# Patient Record
Sex: Female | Born: 1960 | Race: Black or African American | Hispanic: No | Marital: Married | Smoking: Never smoker
Health system: Southern US, Community
[De-identification: ages and names within clinical notes are randomized; demographics above are authoritative.]

---

## 2011-03-02 DIAGNOSIS — K219 Gastro-esophageal reflux disease without esophagitis: Secondary | ICD-10-CM | POA: Insufficient documentation

## 2011-03-02 DIAGNOSIS — I10 Essential (primary) hypertension: Secondary | ICD-10-CM | POA: Insufficient documentation

## 2011-11-02 DIAGNOSIS — C50411 Malignant neoplasm of upper-outer quadrant of right female breast: Secondary | ICD-10-CM | POA: Insufficient documentation

## 2013-03-23 ENCOUNTER — Emergency Department: Payer: Self-pay | Admitting: Emergency Medicine

## 2015-06-26 DIAGNOSIS — G43019 Migraine without aura, intractable, without status migrainosus: Secondary | ICD-10-CM | POA: Insufficient documentation

## 2015-06-26 DIAGNOSIS — G2581 Restless legs syndrome: Secondary | ICD-10-CM | POA: Insufficient documentation

## 2015-07-07 IMAGING — CT CT HEAD WITHOUT CONTRAST
1 series · 16 of 28 positions shown, 20 images · non-contrast
Comparison: None.

CLINICAL DATA: Frontal headache with nausea and vomiting.

EXAM:
CT HEAD WITHOUT CONTRAST
TECHNIQUE: Contiguous axial images were obtained from the base of the skull
through the vertex without intravenous contrast.

[Series 2: soft tissue · axial · 0.42mm/px · z∈[+296,+421]mm · 16 of 28 slices shown, 20 images]
[im 2/28  brain]
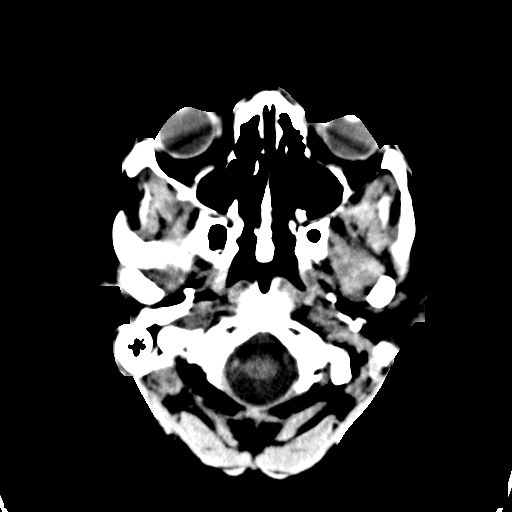
[im 2/28  bone]
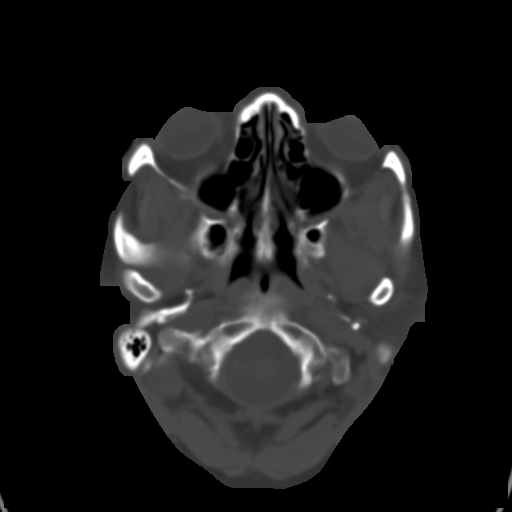
[im 4/28  brain]
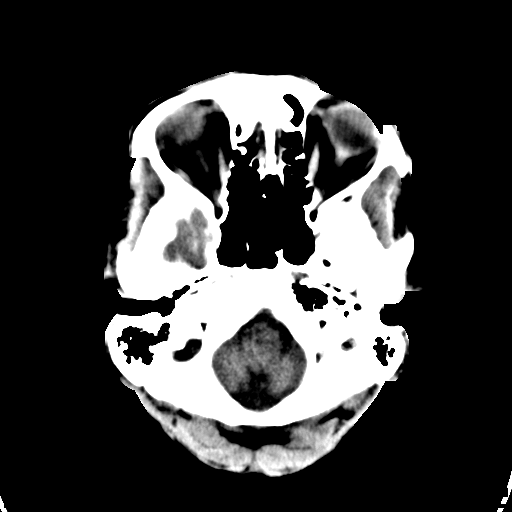
[im 6/28  brain]
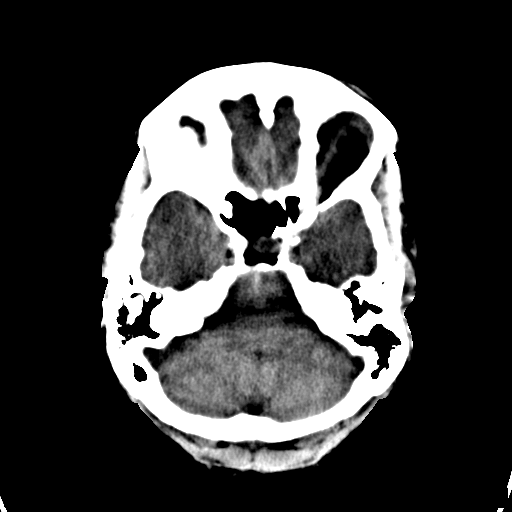
[im 7/28  brain]
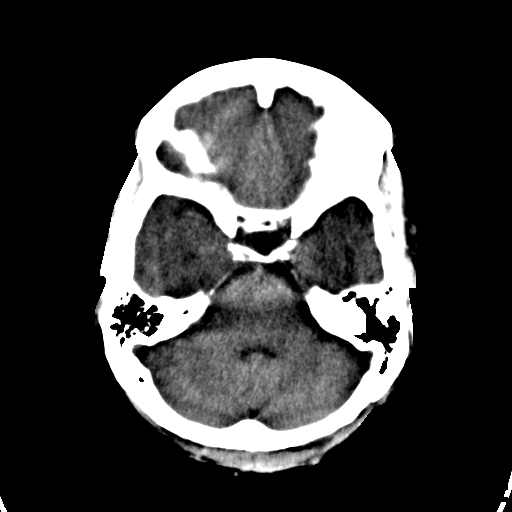
[im 9/28  brain]
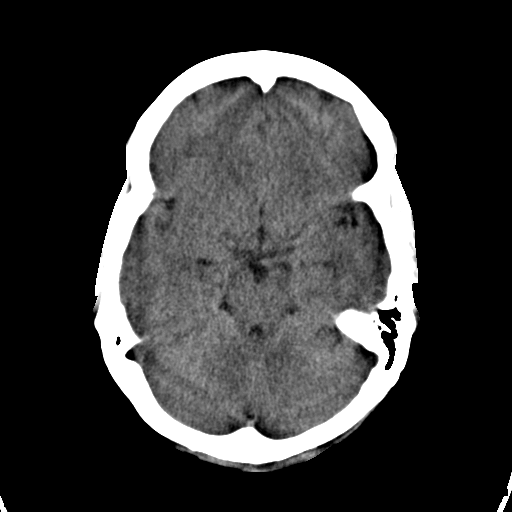
[im 9/28  bone]
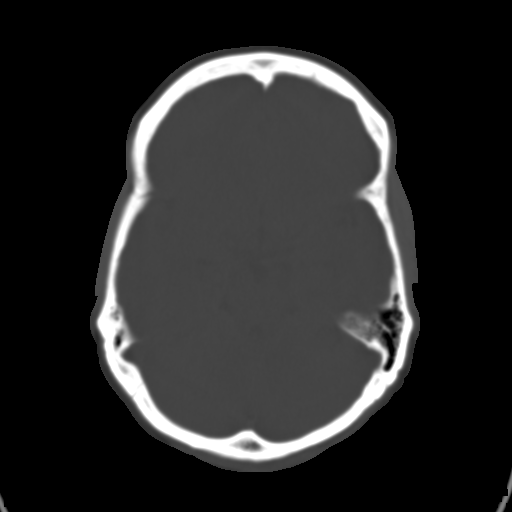
[im 10/28  brain]
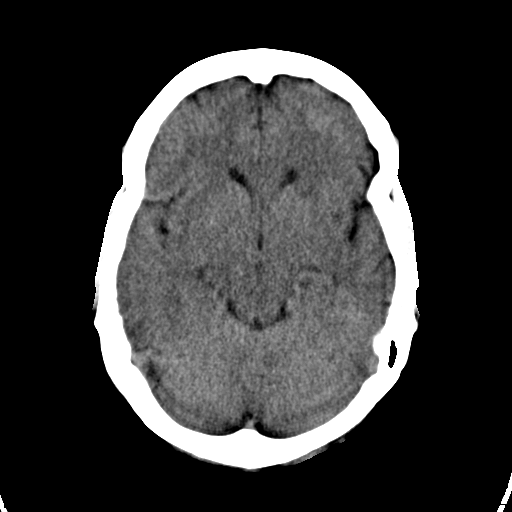
[im 12/28  brain]
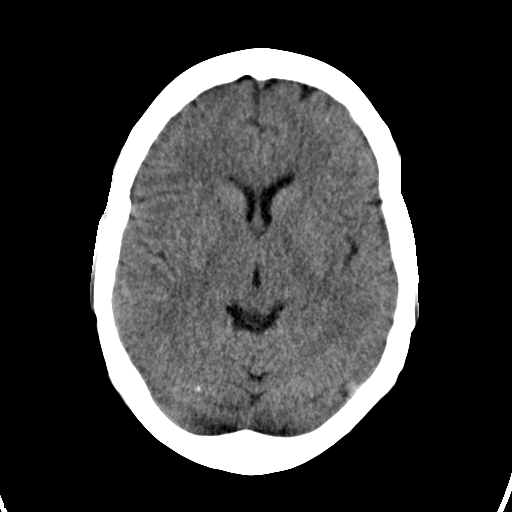
[im 14/28  brain]
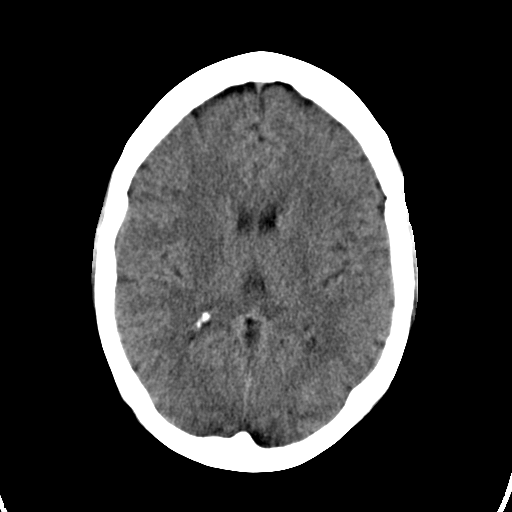
[im 15/28  brain]
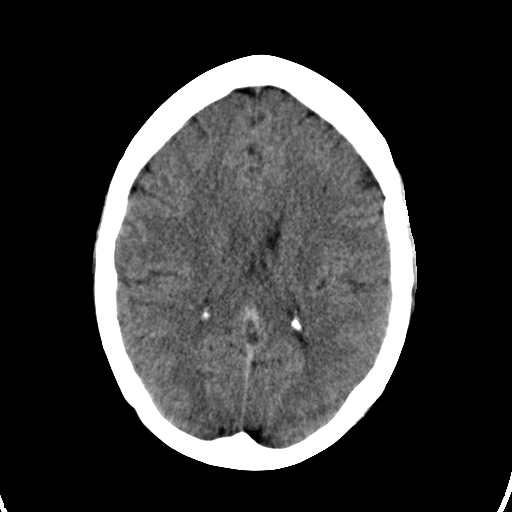
[im 15/28  bone]
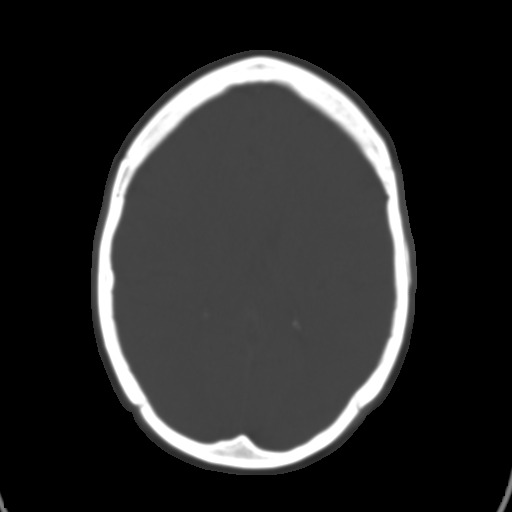
[im 17/28  brain]
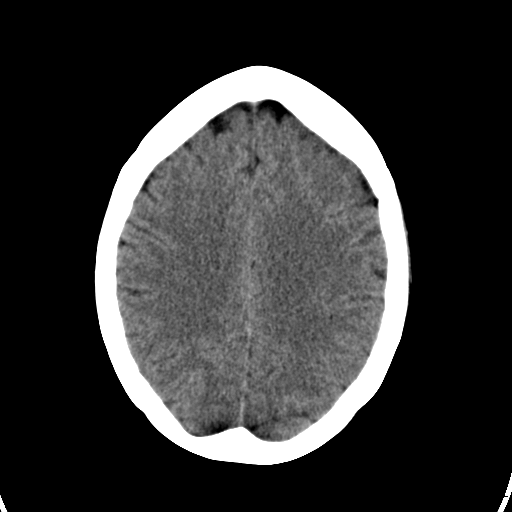
[im 19/28  brain]
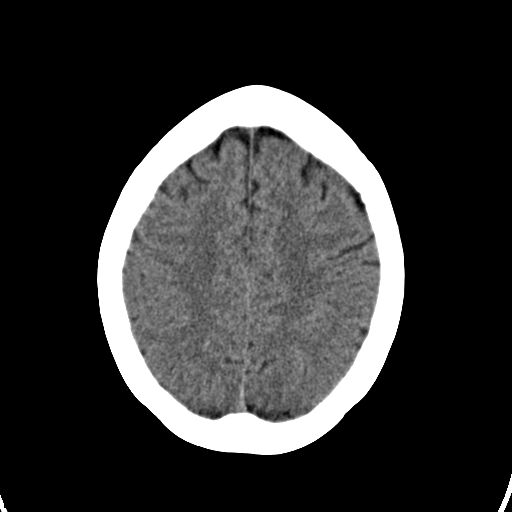
[im 20/28  brain]
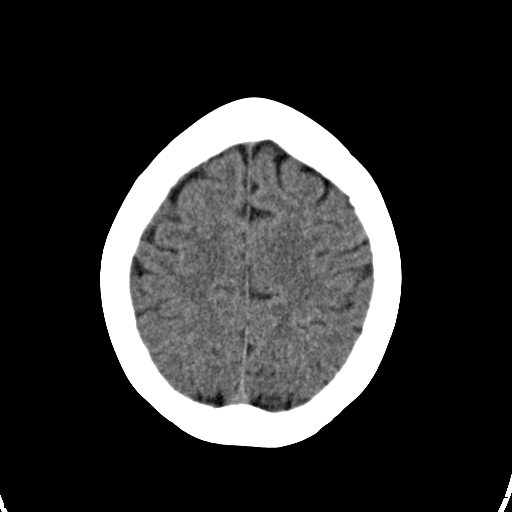
[im 22/28  brain]
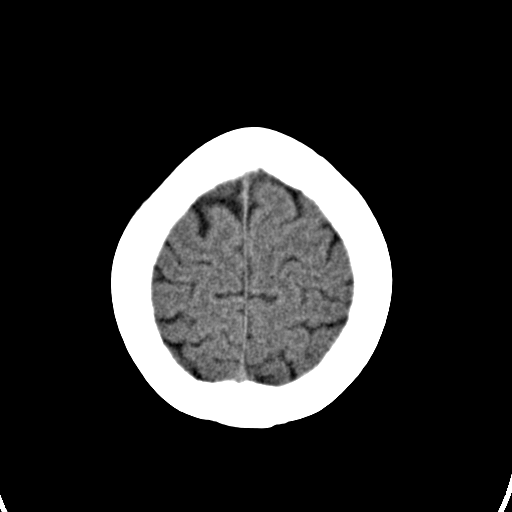
[im 22/28  bone]
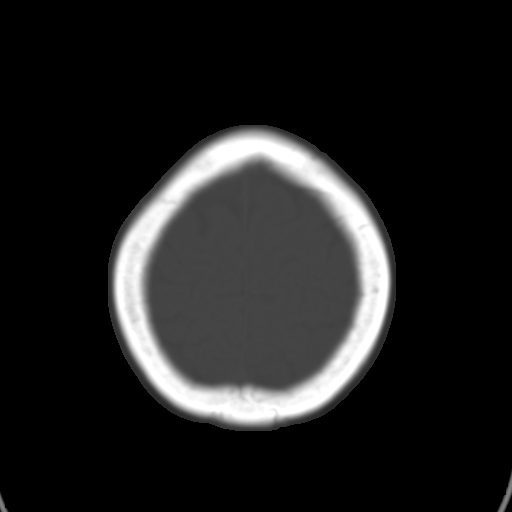
[im 23/28  brain]
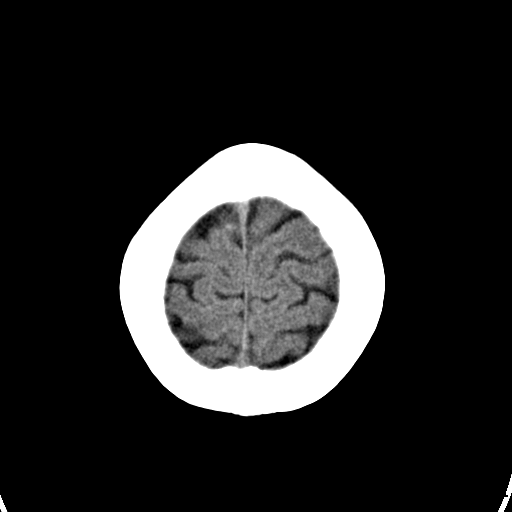
[im 25/28  brain]
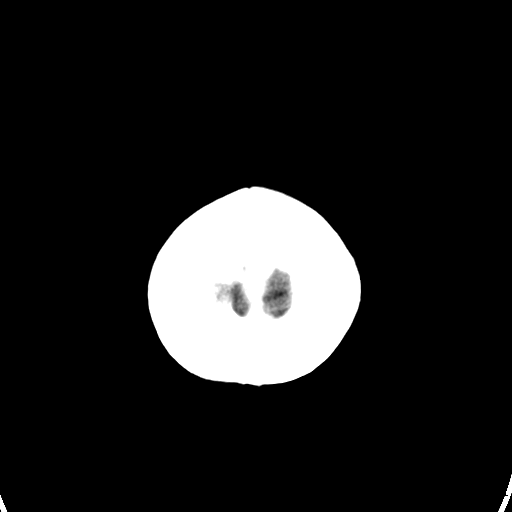
[im 27/28  brain]
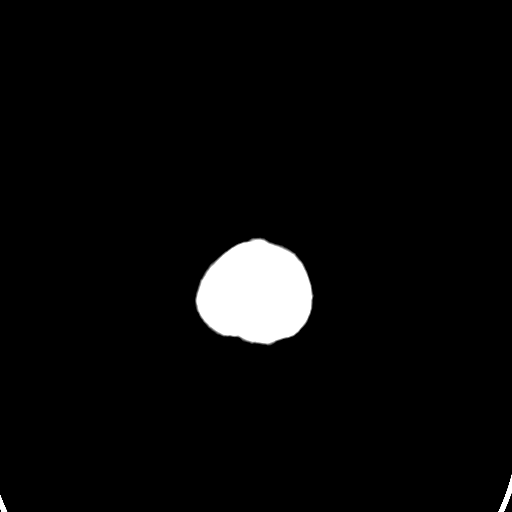

[16 of 28 positions shown; findings below may reference images not displayed]

FINDINGS: The ventricles are normal in size and position. There is no
intracranial hemorrhage. There is no intracranial mass effect. There
is no finding to suggest an evolving ischemic infarction. The
cerebellum and brainstem exhibit no acute abnormalities.

At bone window settings the observed portions of the paranasal
sinuses and mastoid air cells are clear. There is no evidence of an
acute skull fracture.
IMPRESSION: There is no acute intracranial abnormality.

## 2020-06-04 DIAGNOSIS — E669 Obesity, unspecified: Secondary | ICD-10-CM | POA: Insufficient documentation

## 2020-06-04 DIAGNOSIS — G4733 Obstructive sleep apnea (adult) (pediatric): Secondary | ICD-10-CM | POA: Insufficient documentation

## 2021-09-16 NOTE — Progress Notes (Signed)
[facility] [place], [place] [address]  Pulmonary Sleep Medicine   Office Visit Note  Patient Name: Tamara Marks DOB: [birth year] MRN [id number]    Chief Complaint: Obstructive Sleep Apnea visit  Brief History:  Tamara Marks is seen today for initial sleep consult to establish care on APAP 7-11cmh20.  The patient has a long history of sleep apnea. Patient is using PAP nightly.  The patient feels rested after sleeping with PAP.  The patient reports benefit from PAP use. Reported sleepiness is  improved and the Epworth Sleepiness Score is 3 out of 24. The patient does not take naps. The patient complains of the following: dry mouth, and mask discomfort. Tamara Marks has tried several masks and the one Tamara Marks has is the best. Tamara Marks has used lozenges and adjusted humidity and can't seem to get relief. Suggested adding humidity to the room to help this. The compliance download shows 100% compliance (for period since replacement recall machine set) with an average use time of 9 hours. The AHI is 1.7  The patient does not complain of limb movements disrupting sleep. Before CPAP patient reports that family members reported Tamara Marks loud snoring and witnessed apneas.   ROS  General: (-) fever, (-) chills, (-) night sweat Nose and Sinuses: (-) nasal stuffiness or itchiness, (-) postnasal drip, (-) nosebleeds, (-) sinus trouble. Mouth and Throat: (-) sore throat, (-) hoarseness. Neck: (-) swollen glands, (-) enlarged thyroid, (-) neck pain. Respiratory: - cough, - shortness of breath, - wheezing. Neurologic: - numbness, - tingling. Psychiatric: - anxiety, - depression   Current Medication: Outpatient Encounter Medications as of 09/17/2021  Medication Sig   amLODipine (NORVASC) 10 MG tablet amlodipine 10 mg tablet   fluticasone (FLONASE) 50 MCG/ACT nasal spray fluticasone propionate 50 mcg/actuation nasal spray,suspension   lisinopril (ZESTRIL) 40 MG tablet lisinopril 40 mg tablet    metFORMIN (GLUCOPHAGE-XR) 500 MG 24 hr tablet metformin ER 500 mg tablet,extended release 24 hr   metoprolol succinate (TOPROL-XL) 100 MG 24 hr tablet metoprolol succinate ER 100 mg tablet,extended release 24 hr   famotidine (PEPCID) 20 MG tablet Take 20 mg by mouth at bedtime.   FERATE 240 (27 Fe) MG tablet Take 1 tablet by mouth daily.   loratadine (CLARITIN) 10 MG tablet Take by mouth.   meclizine (ANTIVERT) 25 MG tablet Take 25 mg by mouth daily.   pantoprazole (PROTONIX) 40 MG tablet Take 40 mg by mouth 2 (two) times daily.   rosuvastatin (CRESTOR) 10 MG tablet Take 10 mg by mouth daily.   SUMAtriptan (IMITREX) 100 MG tablet sumatriptan 196 mg tablet   TRULICITY 1.5 QI/2.9NL SOPN SMARTSIG:0.5 Milliliter(s) SUB-Q Once a Week   No facility-administered encounter medications on file as of 09/17/2021.    Surgical History: History reviewed. No pertinent surgical history.  Medical History: History reviewed. No pertinent past medical history.  Family History: Non contributory to the present illness  Social History: Social History   Socioeconomic History   Marital status: Married    Spouse name: Not on file   Number of children: Not on file   Years of education: Not on file   Highest education level: Not on file  Occupational History   Not on file  Tobacco Use   Smoking status: Never   Smokeless tobacco: Never  Substance and Sexual Activity   Alcohol use: Not Currently   Drug use: Not on file   Sexual activity: Not on file  Other Topics Concern   Not on file  Social History Narrative   Not on file   Social Determinants of Health   Financial Resource Strain: Not on file  Food Insecurity: Not on file  Transportation Needs: Not on file  Physical Activity: Not on file  Stress: Not on file  Social Connections: Not on file  Intimate Partner Violence: Not on file    Vital Signs: Blood pressure 130/88, pulse 89, resp. rate 12, height '5\' 6"'$  (1.676 m), weight 190 lb (86.2  kg), SpO2 97 %. Body mass index is 30.67 kg/m.    Examination: General Appearance: The patient is well-developed, well-nourished, and in no distress. Neck Circumference: 37.5cm Skin: Gross inspection of skin unremarkable. Head: normocephalic, no gross deformities. Eyes: no gross deformities noted. ENT: ears appear grossly normal Neurologic: Alert and oriented. No involuntary movements.    EPWORTH SLEEPINESS SCALE:  Scale:  (0)= no chance of dozing; (1)= slight chance of dozing; (2)= moderate chance of dozing; (3)= high chance of dozing  Chance  Situtation    Sitting and reading: 1    Watching TV: 1    Sitting Inactive in public: 0    As a passenger in car: 0      Lying down to rest: 1    Sitting and talking: 0    Sitting quielty after lunch: 0    In a car, stopped in traffic: 0   TOTAL SCORE:   3 out of 24    SLEEP STUDIES:  PSG (04/14/2011)-AHI 5.9, (REM 14.9), min SP02 77% CPAP(04/16/2011)  CPAP'@5cmh20'$  PSG (09/04/2013) AHI 22.5, min SP02 66% CPAP (09/25/2013)APAP 7-11cmh20 PSG (01/10/2020)AHI 7.3, min SP02- 69%   CPAP COMPLIANCE DATA:  Date Range: 09/03/21-09/16/21  Average Daily Use: 9 hours  Median Use: 9 hours  Compliance for > 4 Hours: 100% days  AHI: 1.7 respiratory events per hour  Days Used: 14/14  Mask Leak: 2 minute  95th Percentile Pressure: 10.4         LABS: No results found for this or any previous visit (from the past 2160 hour(s)).  Radiology: CT HEAD LIMITED W/O CM  Result Date: 03/23/2013 * PRIOR REPORT IMPORTED FROM AN EXTERNAL SYSTEM **CLINICAL DATA:  Frontal headache with nausea and vomiting. EXAM: CT HEAD WITHOUT CONTRAST TECHNIQUE: Contiguous axial images were obtained from the base of the skull through the vertex without intravenous contrast. COMPARISON:  None. FINDINGS: The ventricles are normal in size and position. There is no intracranial hemorrhage. There is no intracranial mass effect. There is no finding to  suggest an evolving ischemic infarction. The cerebellum and brainstem exhibit no acute abnormalities. At bone window settings the observed portions of the paranasal sinuses and mastoid air cells are clear. There is no evidence of an acute skull fracture. IMPRESSION: There is no acute intracranial abnormality. Electronically Signed   By: David  [place]   On: 03/23/2013 16:36     No results found.  No results found.    Assessment and Plan: Patient Active Problem List   Diagnosis Date Noted   Class 1 obesity with serious comorbidity and body mass index (BMI) of 32.0 to 32.9 in adult 06/04/2020   OSA (obstructive sleep apnea) 06/04/2020   Intractable migraine without aura and without status migrainosus 06/26/2015   Restless leg syndrome, uncontrolled 06/26/2015   Malignant neoplasm of upper-outer quadrant of right breast in female, estrogen receptor negative ([place]) 11/02/2011   GERD (gastroesophageal reflux disease) 03/02/2011   Hypertension 03/02/2011      The patient does tolerate PAP and reports  benefit from PAP use. The patient was reminded how to adjust mask fit and advised to change supplies regularly. The patient was also counselled on nightly use. The compliance is excellent. The AHI is 1.7.   1. OSA (obstructive sleep apnea) Continue excellent compliance  2. CPAP use counseling CPAP couseling-Discussed importance of adequate CPAP use as well as proper care and cleaning techniques of machine and all supplies.  3. Primary hypertension Continue current medication and f/u with PCP.  4. Intractable migraine without aura and without status migrainosus Continue current medication and f/u with PCP.  5. Gastroesophageal reflux disease without esophagitis Continue PPI  6. Obesity (BMI 30.0-34.9) Obesity Counseling: Had a lengthy discussion regarding patients BMI and weight issues. Patient was instructed on portion control as well as increased activity. Also discussed caloric  restrictions with trying to maintain intake less than 2000 Kcal. Discussions were made in accordance with the 5As of weight management. Simple actions such as not eating late and if able to, taking a walk is suggested.    General Counseling: I have discussed the findings of the evaluation and examination with Tamara Marks.  I have also discussed any further diagnostic evaluation thatmay be needed or ordered today. Tamara Marks verbalizes understanding of the findings of todays visit. We also reviewed Tamara Marks medications today and discussed drug interactions and side effects including but not limited excessive drowsiness and altered mental states. We also discussed that there is always a risk not just to Tamara Marks but also people around Tamara Marks. Tamara Marks has been encouraged to call the office with any questions or concerns that should arise related to todays visit.  No orders of the defined types were placed in this encounter.       I have personally obtained a history, examined the patient, evaluated laboratory and imaging results, formulated the assessment and plan and placed orders.  This patient was seen by Drema Dallas, PA-C in collaboration with Dr. Devona Konig as a part of collaborative care agreement.  Allyne Gee, MD [facility] Diplomate ABMS Pulmonary Critical Care Medicine and Sleep Medicine

## 2021-09-17 ENCOUNTER — Ambulatory Visit (INDEPENDENT_AMBULATORY_CARE_PROVIDER_SITE_OTHER): Payer: BC Managed Care – PPO | Admitting: Internal Medicine

## 2021-09-17 VITALS — BP 130/88 | HR 89 | Resp 12 | Ht 66.0 in | Wt 190.0 lb

## 2021-09-17 DIAGNOSIS — Z6834 Body mass index (BMI) 34.0-34.9, adult: Secondary | ICD-10-CM

## 2021-09-17 DIAGNOSIS — I10 Essential (primary) hypertension: Secondary | ICD-10-CM | POA: Diagnosis not present

## 2021-09-17 DIAGNOSIS — E669 Obesity, unspecified: Secondary | ICD-10-CM

## 2021-09-17 DIAGNOSIS — Z7189 Other specified counseling: Secondary | ICD-10-CM

## 2021-09-17 DIAGNOSIS — K219 Gastro-esophageal reflux disease without esophagitis: Secondary | ICD-10-CM

## 2021-09-17 DIAGNOSIS — G43019 Migraine without aura, intractable, without status migrainosus: Secondary | ICD-10-CM

## 2021-09-17 DIAGNOSIS — G4733 Obstructive sleep apnea (adult) (pediatric): Secondary | ICD-10-CM

## 2021-09-17 NOTE — Patient Instructions (Signed)

## 2022-09-14 NOTE — Progress Notes (Unsigned)
[facility] [address] [place], [place] [address]  Pulmonary Sleep Medicine   Office Visit Note  Patient Name: Tamara Marks DOB: October 19, 1960 MRN [id number]    Chief Complaint: Obstructive Sleep Apnea visit  Brief History:  Tamara Marks is seen today for an annual follow up visit fo APAP@7 -11 cmH2O. The patient has a long history of sleep apnea. Patient is using PAP nightly.  The patient feels rested after sleeping with PAP.  The patient reports benefiting from PAP use. Reported sleepiness is  improved and the Epworth Sleepiness Score is 3 out of 24. The patient will sometimes take naps. The patient complains of the following: none.  The compliance download shows 99.5% compliance with an average use time of 9 hours 37 minutes. The AHI is 2.4.  The patient does not complain of limb movements disrupting sleep. The patient continues to require PAP therapy in order to eliminate sleep apnea. Her machine is past end of life and needs to be replaced.  ROS  General: (-) fever, (-) chills, (-) night sweat Nose and Sinuses: (-) nasal stuffiness or itchiness, (-) postnasal drip, (-) nosebleeds, (-) sinus trouble. Mouth and Throat: (-) sore throat, (-) hoarseness. Neck: (-) swollen glands, (-) enlarged thyroid, (-) neck pain. Respiratory: - cough, - shortness of breath, - wheezing. Neurologic: - numbness, - tingling. Psychiatric: - anxiety, - depression   Current Medication: Outpatient Encounter Medications as of 09/15/2022  Medication Sig   amLODipine (NORVASC) 10 MG tablet amlodipine 10 mg tablet   famotidine (PEPCID) 20 MG tablet Take 20 mg by mouth at bedtime.   FERATE 240 (27 Fe) MG tablet Take 1 tablet by mouth daily.   fluticasone (FLONASE) 50 MCG/ACT nasal spray fluticasone propionate 50 mcg/actuation nasal spray,suspension   lisinopril (ZESTRIL) 40 MG tablet lisinopril 40 mg tablet   loratadine (CLARITIN) 10 MG tablet Take by mouth.   meclizine (ANTIVERT) 25 MG  tablet Take 25 mg by mouth daily.   metFORMIN (GLUCOPHAGE-XR) 500 MG 24 hr tablet metformin ER 500 mg tablet,extended release 24 hr   metoprolol succinate (TOPROL-XL) 100 MG 24 hr tablet metoprolol succinate ER 100 mg tablet,extended release 24 hr   pantoprazole (PROTONIX) 40 MG tablet Take 40 mg by mouth 2 (two) times daily.   rosuvastatin (CRESTOR) 10 MG tablet Take 10 mg by mouth daily.   SUMAtriptan (IMITREX) 100 MG tablet sumatriptan 100 mg tablet   TRULICITY 1.5 MG/0.5ML SOPN SMARTSIG:0.5 Milliliter(s) SUB-Q Once a Week   No facility-administered encounter medications on file as of 09/15/2022.    Surgical History: History reviewed. No pertinent surgical history.  Medical History: History reviewed. No pertinent past medical history.  Family History: Non contributory to the present illness  Social History: Social History   Socioeconomic History   Marital status: Married    Spouse name: Not on file   Number of children: Not on file   Years of education: Not on file   Highest education level: Not on file  Occupational History   Not on file  Tobacco Use   Smoking status: Never   Smokeless tobacco: Never  Substance and Sexual Activity   Alcohol use: Not Currently   Drug use: Not on file   Sexual activity: Not on file  Other Topics Concern   Not on file  Social History Narrative   Not on file   Social Determinants of Health   Financial Resource Strain: Not on file  Food Insecurity: Not on file  Transportation Needs: Not on file  Physical Activity: Not on file  Stress: Not on file  Social Connections: Not on file  Intimate Partner Violence: Not on file    Vital Signs: Blood pressure 130/86, pulse 83, resp. rate 18, height 5\' 6"  (1.676 m), weight 176 lb (79.8 kg), SpO2 95 %. Body mass index is 28.41 kg/m.    Examination: General Appearance: The patient is well-developed, well-nourished, and in no distress. Neck Circumference: 37.5 cm Skin: Gross inspection  of skin unremarkable. Head: normocephalic, no gross deformities. Eyes: no gross deformities noted. ENT: ears appear grossly normal Neurologic: Alert and oriented. No involuntary movements.  STOP BANG RISK ASSESSMENT S (snore) Have you been told that you snore?     NO   T (tired) Are you often tired, fatigued, or sleepy during the day?   NO  O (obstruction) Do you stop breathing, choke, or gasp during sleep? NO   P (pressure) Do you have or are you being treated for high blood pressure? YES   B (BMI) Is your body index greater than 35 kg/m? NO   A (age) Are you 43 years old or older? YES   N (neck) Do you have a neck circumference greater than 16 inches?   NO   G (gender) Are you a female? NO   TOTAL STOP/BANG "YES" ANSWERS 2       A STOP-Bang score of 2 or less is considered low risk, and a score of 5 or more is high risk for having either moderate or severe OSA. For people who score 3 or 4, doctors may need to perform further assessment to determine how likely they are to have OSA.         EPWORTH SLEEPINESS SCALE:  Scale:  (0)= no chance of dozing; (1)= slight chance of dozing; (2)= moderate chance of dozing; (3)= high chance of dozing  Chance  Situtation    Sitting and reading: 1    Watching TV: 1    Sitting Inactive in public: 0    As a passenger in car: 0      Lying down to rest: 1    Sitting and talking: 0    Sitting quielty after lunch: 0    In a car, stopped in traffic: 0   TOTAL SCORE:   3 out of 24    SLEEP STUDIES:  PSG (03/2011) AHI 6/hr, REM AHI 15/hr, min SpO2 77% Titration (03/2011) CPAP@ 5 cmH2O PSG (08/2013) AHI 23/hr, min SpO2 66% Titration (09/2013) APAP@ 7-11 cmH2O PSG (12/2019) AHI 7.3/hr, min SpO2 69%   CPAP COMPLIANCE DATA:  Date Range: 09/15/2021-09/14/2022  Average Daily Use: 9 hours 37 minutes  Median Use: 9 hours 35 minutes  Compliance for > 4 Hours: 99.5%  AHI: 2.4 respiratory events per hour  Days Used: 364/365  days  Mask Leak: 32   95th Percentile Pressure: 10.8         LABS: No results found for this or any previous visit (from the past 2160 hour(s)).  Radiology: CT HEAD LIMITED W/O CM  Result Date: 03/23/2013 **** PRIOR REPORT IMPORTED FROM AN EXTERNAL SYSTEM **** CLINICAL DATA:  Frontal headache with nausea and vomiting. EXAM: CT HEAD WITHOUT CONTRAST TECHNIQUE: Contiguous axial images were obtained from the base of the skull through the vertex without intravenous contrast. COMPARISON:  None. FINDINGS: The ventricles are normal in size and position. There is no intracranial hemorrhage. There is no intracranial mass effect. There is no finding to suggest an evolving ischemic infarction. The  cerebellum and brainstem exhibit no acute abnormalities. At bone window settings the observed portions of the paranasal sinuses and mastoid air cells are clear. There is no evidence of an acute skull fracture. IMPRESSION: There is no acute intracranial abnormality. Electronically Signed   By: David  [place]   On: 03/23/2013 16:36     No results found.  No results found.    Assessment and Plan: Patient Active Problem List   Diagnosis Date Noted   Class 1 obesity with serious comorbidity and body mass index (BMI) of 32.0 to 32.9 in adult 06/04/2020   OSA (obstructive sleep apnea) 06/04/2020   Intractable migraine without aura and without status migrainosus 06/26/2015   Restless leg syndrome, uncontrolled 06/26/2015   Malignant neoplasm of upper-outer quadrant of right breast in female, estrogen receptor negative ([facility]) 11/02/2011   GERD (gastroesophageal reflux disease) 03/02/2011   Hypertension 03/02/2011      The patient does tolerate PAP and reports benefit from PAP use. The patient was reminded how to adjust mask fit and advised to change supplies regularly. The patient was also counselled on nightly use. The compliance is excellent. The AHI is 2.4.The patient's machine is past end of life  and must be replaced. Patient continues to require PAP to treat their apnea and is medically necessary.   1. OSA (obstructive sleep apnea) continue excellent compliance. Follow up 30+ days after set up  2. CPAP use counseling CPAP couseling-Discussed importance of adequate CPAP use as well as proper care and cleaning techniques of machine and all supplies.  3. Primary hypertension Continue current medication and f/u with PCP.  4. Intractable migraine without aura and without status migrainosus Continue current medication and f/u with PCP.  5. Gastroesophageal reflux disease without esophagitis Continue PPI      General Counseling: I have discussed the findings of the evaluation and examination with Claris Che.  I have also discussed any further diagnostic evaluation thatmay be needed or ordered today. Vondra verbalizes understanding of the findings of todays visit. We also reviewed her medications today and discussed drug interactions and side effects including but not limited excessive drowsiness and altered mental states. We also discussed that there is always a risk not just to her but also people around her. she has been encouraged to call the office with any questions or concerns that should arise related to todays visit.  No orders of the defined types were placed in this encounter.       I have personally obtained a history, examined the patient, evaluated laboratory and imaging results, formulated the assessment and plan and placed orders.  This patient was seen by Lynn Ito, PA-C in collaboration with Dr. Freda Munro as a part of collaborative care agreement.  Yevonne Pax, MD [facility] Diplomate ABMS Pulmonary Critical Care Medicine and Sleep Medicine

## 2022-09-15 ENCOUNTER — Ambulatory Visit (INDEPENDENT_AMBULATORY_CARE_PROVIDER_SITE_OTHER): Payer: BC Managed Care – PPO | Admitting: Internal Medicine

## 2022-09-15 VITALS — BP 130/86 | HR 83 | Resp 18 | Ht 66.0 in | Wt 176.0 lb

## 2022-09-15 DIAGNOSIS — G43019 Migraine without aura, intractable, without status migrainosus: Secondary | ICD-10-CM | POA: Diagnosis not present

## 2022-09-15 DIAGNOSIS — Z7189 Other specified counseling: Secondary | ICD-10-CM

## 2022-09-15 DIAGNOSIS — G4733 Obstructive sleep apnea (adult) (pediatric): Secondary | ICD-10-CM | POA: Diagnosis not present

## 2022-09-15 DIAGNOSIS — K219 Gastro-esophageal reflux disease without esophagitis: Secondary | ICD-10-CM

## 2022-09-15 DIAGNOSIS — I10 Essential (primary) hypertension: Secondary | ICD-10-CM | POA: Diagnosis not present

## 2022-09-15 NOTE — Patient Instructions (Signed)

## 2022-12-21 NOTE — Progress Notes (Signed)
[facility] [address] [place], [place] [address]  Pulmonary Sleep Medicine   Office Visit Note  Patient Name: Tamara Marks DOB: 1961/04/19 MRN [id number]    Chief Complaint: Obstructive Sleep Apnea visit  Brief History:  Tamara Marks is seen today for a follow up visit for APAP@ 7-11 cmH2O. The patient has a longstanding history of sleep apnea. Patient is using PAP nightly.  The patient feels rested after sleeping with PAP.  The patient reports benefiting from PAP use. Reported sleepiness is  improved and the Epworth Sleepiness Score is 3 out of 24. The patient does will rarely take naps. The patient complains of the following: none.  The compliance download shows 100% compliance with an average use time of 10 hours 22 minutes. The AHI is 1.0.  The patient does not complain of limb movements disrupting sleep. The patient continues to require PAP therapy in order to eliminate sleep apnea.   ROS  General: (-) fever, (-) chills, (-) night sweat Nose and Sinuses: (-) nasal stuffiness or itchiness, (-) postnasal drip, (-) nosebleeds, (-) sinus trouble. Mouth and Throat: (-) sore throat, (-) hoarseness. Neck: (-) swollen glands, (-) enlarged thyroid, (-) neck pain. Respiratory: - cough, - shortness of breath, - wheezing. Neurologic: - numbness, - tingling. Psychiatric: - anxiety, - depression   Current Medication: Outpatient Encounter Medications as of 12/22/2022  Medication Sig   amLODipine (NORVASC) 10 MG tablet amlodipine 10 mg tablet   famotidine (PEPCID) 20 MG tablet Take 20 mg by mouth at bedtime.   FERATE 240 (27 Fe) MG tablet Take 1 tablet by mouth daily.   fluticasone (FLONASE) 50 MCG/ACT nasal spray fluticasone propionate 50 mcg/actuation nasal spray,suspension   lisinopril (ZESTRIL) 40 MG tablet lisinopril 40 mg tablet   loratadine (CLARITIN) 10 MG tablet Take by mouth.   meclizine (ANTIVERT) 25 MG tablet Take 25 mg by mouth daily.   metFORMIN  (GLUCOPHAGE-XR) 500 MG 24 hr tablet metformin ER 500 mg tablet,extended release 24 hr   metoprolol succinate (TOPROL-XL) 100 MG 24 hr tablet metoprolol succinate ER 100 mg tablet,extended release 24 hr   pantoprazole (PROTONIX) 40 MG tablet Take 40 mg by mouth 2 (two) times daily.   rosuvastatin (CRESTOR) 10 MG tablet Take 10 mg by mouth daily.   SUMAtriptan (IMITREX) 100 MG tablet sumatriptan 100 mg tablet   TRULICITY 1.5 MG/0.5ML SOPN SMARTSIG:0.5 Milliliter(s) SUB-Q Once a Week   No facility-administered encounter medications on file as of 12/22/2022.    Surgical History: History reviewed. No pertinent surgical history.  Medical History: History reviewed. No pertinent past medical history.  Family History: Non contributory to the present illness  Social History: Social History   Socioeconomic History   Marital status: Married    Spouse name: Not on file   Number of children: Not on file   Years of education: Not on file   Highest education level: Not on file  Occupational History   Not on file  Tobacco Use   Smoking status: Never   Smokeless tobacco: Never  Substance and Sexual Activity   Alcohol use: Not Currently   Drug use: Not on file   Sexual activity: Not on file  Other Topics Concern   Not on file  Social History Narrative   Not on file   Social Determinants of Health   Financial Resource Strain: Medium Risk (07/13/2022)   Received [facility], [facility]   Overall Financial Resource Strain (CARDIA)  Difficulty of Paying Living Expenses: Somewhat hard  Food Insecurity: No Food Insecurity (07/13/2022)   Received from [facility], [facility]   Hunger Vital Sign    Worried About Running Out of Food in the Last Year: Never true    Ran Out of Food in the Last Year: Never true  Transportation Needs: No Transportation Needs (07/13/2022)   Received from [facility],  [facility]   [facility] - Transportation    In the past 12 months, has lack of transportation kept you from medical appointments or from getting medications?: No    Lack of Transportation (Non-Medical): No  Physical Activity: Inactive (03/03/2021)   Received from [facility], [facility]   Exercise Vital Sign    Days of Exercise per Week: 0 days    Minutes of Exercise per Session: 0 min  Stress: No Stress Concern Present (03/03/2021)   Received from [facility], [facility]   [employer] of Occupational Health - Occupational Stress Questionnaire    Feeling of Stress : Only a little  Social Connections: Unknown (03/03/2021)   Received from [facility], [facility]   Social Connection and Isolation Panel [NHANES]    Frequency of Communication with Friends and Family: More than three times a week    Frequency of Social Gatherings with Friends and Family: Never    Attends Religious Services: Patient declined    Database administrator or Organizations: No    Attends Banker Meetings: Never    Marital Status: Divorced  Catering manager Violence: Not on file    Vital Signs: Blood pressure 130/83, pulse 81, resp. rate 16, height 5\' 6"  (1.676 m), weight 170 lb (77.1 kg), SpO2 96%. Body mass index is 27.44 kg/m.    Examination: General Appearance: The patient is well-developed, well-nourished, and in no distress. Neck Circumference: 37.5 cm Skin: Gross inspection of skin unremarkable. Head: normocephalic, no gross deformities. Eyes: no gross deformities noted. ENT: ears appear grossly normal Neurologic: Alert and oriented. No involuntary movements.  STOP BANG RISK ASSESSMENT S (snore) Have you been told that you snore?     NO   T (tired) Are you often tired, fatigued, or sleepy during the day?   NO  O (obstruction) Do you stop breathing, choke,  or gasp during sleep? NO   P (pressure) Do you have or are you being treated for high blood pressure? YES   B (BMI) Is your body index greater than 35 kg/m? NO   A (age) Are you 65 years old or older? YES   N (neck) Do you have a neck circumference greater than 16 inches?   NO   G (gender) Are you a female? NO   TOTAL STOP/BANG "YES" ANSWERS 2       A STOP-Bang score of 2 or less is considered low risk, and a score of 5 or more is high risk for having either moderate or severe OSA. For people who score 3 or 4, doctors may need to perform further assessment to determine how likely they are to have OSA.         EPWORTH SLEEPINESS SCALE:  Scale:  (0)= no chance of dozing; (1)= slight chance of dozing; (2)= moderate chance of dozing; (3)= high chance of dozing  Chance  Situtation    Sitting and reading: 1    Watching TV: 1  Sitting Inactive in public: 0    As a passenger in car: 0      Lying down to rest: 1    Sitting and talking: 0    Sitting quielty after lunch: 0    In a car, stopped in traffic: 0   TOTAL SCORE:   3 out of 24    SLEEP STUDIES:  PSG (03/2011) AHI 6/hr, REM AHI 15/hr, min SpO2 77% Titration (03/2011) CPAP@ 5 cmH2O PSG (08/2013) AHI 23/hr, min SpO2 66% Titration (09/2013) APAP@ 7-11 cmH2O PSG (12/2019) AHI 7.3/hr,  min SpO2 69%   CPAP COMPLIANCE DATA:  Date Range: 10/22/2022-12/20/2022  Average Daily Use: 10 hours 22 minutes  Median Use: 10 hours 25 minutes  Compliance for > 4 Hours: 100%  AHI: 1.0 respiratory events per hour  Days Used: 60/60 days  Mask Leak: 17  95th Percentile Pressure: 10.8         LABS: No results found for this or any previous visit (from the past 2160 hour(s)).  Radiology: CT HEAD LIMITED W/O CM  Result Date: 03/23/2013 **** PRIOR REPORT IMPORTED FROM AN EXTERNAL SYSTEM **** CLINICAL DATA:  Frontal headache with nausea and vomiting. EXAM: CT HEAD WITHOUT CONTRAST TECHNIQUE: Contiguous axial  images were obtained from the base of the skull through the vertex without intravenous contrast. COMPARISON:  None. FINDINGS: The ventricles are normal in size and position. There is no intracranial hemorrhage. There is no intracranial mass effect. There is no finding to suggest an evolving ischemic infarction. The cerebellum and brainstem exhibit no acute abnormalities. At bone window settings the observed portions of the paranasal sinuses and mastoid air cells are clear. There is no evidence of an acute skull fracture. IMPRESSION: There is no acute intracranial abnormality. Electronically Signed   By: David  [place]   On: 03/23/2013 16:36     No results found.  No results found.    Assessment and Plan: Patient Active Problem List   Diagnosis Date Noted   Class 1 obesity with serious comorbidity and body mass index (BMI) of 32.0 to 32.9 in adult 06/04/2020   OSA (obstructive sleep apnea) 06/04/2020   Intractable migraine without aura and without status migrainosus 06/26/2015   Restless leg syndrome, uncontrolled 06/26/2015   Malignant neoplasm of upper-outer quadrant of right breast in female, estrogen receptor negative (HCC) 11/02/2011   GERD (gastroesophageal reflux disease) 03/02/2011   Hypertension 03/02/2011      The patient does tolerate PAP and reports benefit from PAP use. The patient was reminded how to adjust mask fit and advised to change supplies regularly. The patient was also counselled on nightly use. The compliance is excellent. The AHI is 1.0. Patient continues to require PAP to treat their apnea and is medically necessary.   1. OSA (obstructive sleep apnea) Continue excellent compliance  2. CPAP use counseling CPAP couseling-Discussed importance of adequate CPAP use as well as proper care and cleaning techniques of machine and all supplies.  3. Primary hypertension Continue current medication and f/u with PCP.  4. Intractable migraine without aura and without  status migrainosus Continue current medication and f/u with PCP.  5. Gastroesophageal reflux disease without esophagitis Continue current medication and f/u with PCP.    General Counseling: I have discussed the findings of the evaluation and examination with Tamara Marks.  I have also discussed any further diagnostic evaluation thatmay be needed or ordered today. Tamara Marks understanding of the findings of todays visit. We also reviewed her medications today and  discussed drug interactions and side effects including but not limited excessive drowsiness and altered mental states. We also discussed that there is always a risk not just to her but also people around her. she has been encouraged to call the office with any questions or concerns that should arise related to todays visit.  No orders of the defined types were placed in this encounter.       I have personally obtained a history, examined the patient, evaluated laboratory and imaging results, formulated the assessment and plan and placed orders.  This patient was seen by Lynn Ito, PA-C in collaboration with Dr. Freda Munro as a part of collaborative care agreement.  Yevonne Pax, MD [facility] Diplomate ABMS Pulmonary Critical Care Medicine and Sleep Medicine

## 2022-12-22 ENCOUNTER — Ambulatory Visit (INDEPENDENT_AMBULATORY_CARE_PROVIDER_SITE_OTHER): Payer: BC Managed Care – PPO | Admitting: Internal Medicine

## 2022-12-22 VITALS — BP 130/83 | HR 81 | Resp 16 | Ht 66.0 in | Wt 170.0 lb

## 2022-12-22 DIAGNOSIS — I10 Essential (primary) hypertension: Secondary | ICD-10-CM

## 2022-12-22 DIAGNOSIS — K219 Gastro-esophageal reflux disease without esophagitis: Secondary | ICD-10-CM

## 2022-12-22 DIAGNOSIS — G43019 Migraine without aura, intractable, without status migrainosus: Secondary | ICD-10-CM | POA: Diagnosis not present

## 2022-12-22 DIAGNOSIS — Z7189 Other specified counseling: Secondary | ICD-10-CM

## 2022-12-22 DIAGNOSIS — G4733 Obstructive sleep apnea (adult) (pediatric): Secondary | ICD-10-CM

## 2022-12-22 NOTE — Patient Instructions (Signed)
